# Patient Record
Sex: Male | Born: 1980 | Race: Black or African American | Hispanic: No | Marital: Married | Smoking: Current every day smoker
Health system: Southern US, Community
[De-identification: ages and names within clinical notes are randomized; demographics above are authoritative.]

## PROBLEM LIST (undated history)

## (undated) HISTORY — PX: FOOT SURGERY: SHX648

## (undated) HISTORY — PX: FRACTURE SURGERY: SHX138

## (undated) HISTORY — PX: CYST EXCISION: SHX5701

## (undated) HISTORY — PX: LEG SURGERY: SHX1003

---

## 2004-08-17 ENCOUNTER — Emergency Department (HOSPITAL_COMMUNITY): Admission: EM | Admit: 2004-08-17 | Discharge: 2004-08-17 | Payer: Self-pay | Admitting: *Deleted

## 2009-10-11 ENCOUNTER — Ambulatory Visit (HOSPITAL_COMMUNITY): Admission: RE | Admit: 2009-10-11 | Discharge: 2009-10-11 | Payer: Self-pay | Admitting: Family Medicine

## 2010-01-22 ENCOUNTER — Emergency Department (HOSPITAL_COMMUNITY): Admission: EM | Admit: 2010-01-22 | Discharge: 2010-01-22 | Payer: Self-pay | Admitting: Emergency Medicine

## 2016-05-17 ENCOUNTER — Emergency Department (HOSPITAL_COMMUNITY)
Admission: EM | Admit: 2016-05-17 | Discharge: 2016-05-17 | Disposition: A | Discharge status: Home / Self Care | Payer: Self-pay | Attending: Emergency Medicine | Admitting: Emergency Medicine

## 2016-05-17 ENCOUNTER — Encounter (HOSPITAL_COMMUNITY): Payer: Self-pay | Admitting: Emergency Medicine

## 2016-05-17 DIAGNOSIS — Y990 Civilian activity done for income or pay: Secondary | ICD-10-CM | POA: Insufficient documentation

## 2016-05-17 DIAGNOSIS — W57XXXA Bitten or stung by nonvenomous insect and other nonvenomous arthropods, initial encounter: Secondary | ICD-10-CM | POA: Insufficient documentation

## 2016-05-17 DIAGNOSIS — Y93H2 Activity, gardening and landscaping: Secondary | ICD-10-CM | POA: Insufficient documentation

## 2016-05-17 DIAGNOSIS — Y929 Unspecified place or not applicable: Secondary | ICD-10-CM | POA: Insufficient documentation

## 2016-05-17 DIAGNOSIS — S90861A Insect bite (nonvenomous), right foot, initial encounter: Secondary | ICD-10-CM | POA: Insufficient documentation

## 2016-05-17 DIAGNOSIS — F17210 Nicotine dependence, cigarettes, uncomplicated: Secondary | ICD-10-CM | POA: Insufficient documentation

## 2016-05-17 DIAGNOSIS — S90821A Blister (nonthermal), right foot, initial encounter: Secondary | ICD-10-CM

## 2016-05-17 MED ORDER — MUPIROCIN CALCIUM 2 % NA OINT: TOPICAL_OINTMENT | NASAL | Status: DC

## 2016-05-17 NOTE — Discharge Instructions (Signed)
Blisters A blister is a fluid-filled sac that forms between layers of skin. Blisters often form in areas where skin rubs against other skin or rubs against something else. The most common areas for blisters are the hands and feet. CAUSES A blister can be caused by:  An injury.  A burn.  An allergic reaction.  An infection.  Exposure to irritating chemicals.  Friction. Friction blisters often result from:  Sports.  Repetitive activities.  Shoes that are too tight or too loose. SIGNS AND SYMPTOMS A blister is often round and looks like a bump. It may itch or be painful to the touch. The liquid in a blister is clear or bloody. Before a blister forms, the skin may become red, feel warm, itch, or be painful to the touch. DIAGNOSIS A blister can usually be diagnosed from its appearance. TREATMENT Treatment involves protecting the area where the blister has formed until the skin has healed. If something is likely to rub against the blister, apply a bandage (dressing) with a hole in the middle over the blister. Most blisters break open, dry up, and go away on their own within 10 days. Rarely, blisters that are very painful may be drained before they break open on their own. Draining of a blister should only be done by a health care provider under sterile conditions. HOME CARE INSTRUCTIONS  Protect the area where the blister has formed as directed by your health care provider.  Do not open or pop your blister, because it could become infected.  If the blister is very painful, ask your health care provider whether you should have it drained.  If the blister breaks open on its own:  Do not remove the loose skin that is over the blister.  Wash the blister area with soap and water every day.  After washing the blister area, you may apply an antibiotic cream or ointment and cover the area with a bandage. PREVENTION Taking these steps can help to prevent blisters that are caused by  friction:  Wear comfortable shoes that fit well.  Always wear socks with shoes.  Wear extra socks or use tape, bandages, or pads over blister-prone areas as needed.  Wear protective gear, such as gloves, when participating in sports or activities that can cause blisters.  Use powders as needed to keep your feet dry. SEEK MEDICAL CARE IF:  You have increased redness, swelling, or pain in the blister area.  A puslike discharge is coming from the blister area.  You have a fever.  You have chills.   This information is not intended to replace advice given to you by your health care provider. Make sure you discuss any questions you have with your health care provider.  Wash wound with antibacterial soap and water. Apply antibiotic ointment and cover with a bandage before wearing shoes. Follow up with [facility] [place] and [facility] if your symptoms do not improve. Return to the Emergency Department if you experience severe worsening of your symptoms, fevers, chills, increased redness or swelling around your wound.

## 2016-05-17 NOTE — ED Notes (Signed)
PT has a 1cm wide red, raised, round area on top of right foot. PT believes he may have been bitten by a spider Friday at work, however PT wore boots all day that day. PT reports no sign of bite appeared until Sunday and it has worsened since then. It appears as though area may have blistered at some point and opened. No signs of infection. PT has been cleaning it with peroxide.

## 2016-05-17 NOTE — ED Provider Notes (Signed)
CSN: [id number]     Arrival date & time 05/17/16  1144 History  By signing my name below, I, Freida Busmaniana Omoyeni, attest that this documentation has been prepared under the direction and in the presence of non-physician practitioner, Gaylyn RongSamantha Kasee Hantz, PA-C. Electronically Signed: Freida Busmaniana Omoyeni, Scribe. 05/17/2016. 12:14 PM.  Chief Complaint  Patient presents with  . Insect Bite   The history is provided by the patient. No language interpreter was used.     HPI Comments:  Sharen Counterekeywan C Milos is a 35 y.o. male who presents to the Emergency Department complaining of small, circular, wound to the top of his right foot which he first noticed 4 days ago. He denies pain to the site. Pt has cleaned the site with peroxide and has kept it covered with a band-aid. Pt notes possible spider bite ~ 6 days ago while at work. 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  He denies fever and chills. Pt has no other complaints or symptoms at this time.    History reviewed. No pertinent past medical history. Past Surgical History  Procedure Laterality Date  . Fracture surgery     No family history on file. Social History  Substance Use Topics  . Smoking status: Current Every Day Smoker -- 1.00 packs/day    Types: Cigarettes  . Smokeless tobacco: None  . Alcohol Use: Yes     Comment: 2-3 units per week    Review of Systems  10 systems reviewed and all are negative for acute change except as noted in the HPI.   Allergies  Review of patient's allergies indicates no known allergies.  Home Medications   Prior to Admission medications   Not on File   BP 129/78 mmHg  Pulse 95  Temp(Src) 98.3 F (36.8 C) (Oral)  Resp 20  SpO2 97% Physical Exam  Constitutional: He is oriented to person, place, and time. He appears well-developed and well-nourished. No distress.  HENT:  Head: Normocephalic and atraumatic.  Eyes: Conjunctivae are normal. Right eye exhibits  no discharge. Left eye exhibits no discharge. No scleral icterus.  Cardiovascular: Normal rate and intact distal pulses.   Pulmonary/Chest: Effort normal.  Neurological: He is alert and oriented to person, place, and time. Coordination normal.  Skin: Skin is warm and dry. He is not diaphoretic. No erythema. No pallor.  1 cm x 1 cm superficial open vesicle to the dorsum of the right foot No surrounding erythema, tenderness, fluctuance, or induration   Psychiatric: He has a normal mood and affect. His behavior is normal.  Nursing note and vitals reviewed.   ED Course  Procedures   DIAGNOSTIC STUDIES:  Oxygen Saturation is 97% on RA, normal by my interpretation.    COORDINATION OF CARE:  12:04 PM Discussed treatment plan with pt at bedside and pt agreed to plan.    MDM   Final diagnoses:  Blister of foot without infection, right, initial encounter    Patient to ED for evaluation of wound to right foot. There is a small superficial blister (open vesicle) to dorsum of right foot. No signs of acute infection. Pt is afebrile and hemodynamically stable. Pt is instructed to continue with home care. Antibiotic ointment placed as well as bandaid. Pt has a good understanding of return precautions and is safe for discharge at this time.  I personally performed the services described in this documentation, which was scribed in my presence. The recorded information  has been reviewed and is accurate.     Lester KinsmanSamantha Tripp [place], PA-C 05/18/16 1001  Glynn OctaveStephen Rancour, MD 05/18/16 1030

## 2016-07-21 ENCOUNTER — Emergency Department (HOSPITAL_COMMUNITY): Payer: Self-pay

## 2016-07-21 ENCOUNTER — Emergency Department (HOSPITAL_COMMUNITY)
Admission: EM | Admit: 2016-07-21 | Discharge: 2016-07-21 | Disposition: A | Discharge status: Home / Self Care | Payer: Self-pay | Attending: Emergency Medicine | Admitting: Emergency Medicine

## 2016-07-21 ENCOUNTER — Encounter (HOSPITAL_COMMUNITY): Payer: Self-pay | Admitting: Emergency Medicine

## 2016-07-21 DIAGNOSIS — M6740 Ganglion, unspecified site: Secondary | ICD-10-CM

## 2016-07-21 DIAGNOSIS — F17210 Nicotine dependence, cigarettes, uncomplicated: Secondary | ICD-10-CM | POA: Insufficient documentation

## 2016-07-21 DIAGNOSIS — M67441 Ganglion, right hand: Secondary | ICD-10-CM | POA: Insufficient documentation

## 2016-07-21 MED ORDER — PREDNISONE 20 MG PO TABS
60.0000 mg | ORAL_TABLET | Freq: Once | ORAL | Status: AC
  Administered 2016-07-21: 60 mg via ORAL
  Filled 2016-07-21: qty 3

## 2016-07-21 NOTE — ED Triage Notes (Signed)
Pt. reports right hand cyst with pain and numbness onset this week , denies injury .

## 2016-07-21 NOTE — ED Provider Notes (Signed)
[facility]-EMERGENCY DEPT Provider Note   CSN: [id number] Arrival date & time: 07/21/16  0152     History   Chief Complaint Chief Complaint  Patient presents with  . Hand Problem    HPI Philip Phelps is a 35 y.o. male.  HPI  Pt to the ER with complaints of pain to back pf right hand. He says he had a cyst on his hand he had drained many years ago and was told that it may grow back. He says that he is getting tingling to his fingers and that it is uncomfortable. Denies warmth, weakness, fevers, loss of sensation to fingers.   History reviewed. No pertinent past medical history.  There are no active problems to display for this patient.   Past Surgical History:  Procedure Laterality Date  . FRACTURE SURGERY         Home Medications    Prior to Admission medications   Medication Sig Start Date End Date Taking? Authorizing Provider  mupirocin nasal ointment (BACTROBAN) 2 % Apply to wound daily 05/17/16   Dub MikesSamantha Tripp [place], PA-C    Family History No family history on file.  Social History Social History  Substance Use Topics  . Smoking status: Current Every Day Smoker    Packs/day: 0.00    Types: Cigarettes  . Smokeless tobacco: Never Used  . Alcohol use Yes     Comment: 2-3 units per week     Allergies   Review of patient's allergies indicates no known allergies.   Review of Systems Review of Systems Review of Systems All other systems negative except as documented in the HPI. All pertinent positives and negatives as reviewed in the HPI.   Physical Exam Updated Vital Signs BP 123/80 (BP Location: Right Arm)   Pulse 65   Temp 98 F (36.7 C) (Oral)   Resp 16   Ht 5\' 7"  (1.702 m)   Wt 68 kg   SpO2 98%   BMI 23.49 kg/m   Physical Exam  Musculoskeletal:  neuro vascularity intact.   Physical Exam  Constitutional: She appears well-developed and well-nourished. No distress.  HENT:  Head: Normocephalic and atraumatic.  Eyes: Pupils are equal,  round, and reactive to light.  Neck: Normal range of motion. Neck supple.  Cardiovascular: Normal rate.   Pulmonary/Chest: Effort normal.  Musculoskeletal:  Pt has two small ganglion cyst to posterior of right hand that is tender to the touch. No rash, no abscess or cellulitis.  Neurological: She is alert.  Skin: Skin is warm and dry.  Nursing note and vitals reviewed.   ED Treatments / Results  Labs (all labs ordered are listed, but only abnormal results are displayed) Labs Reviewed - No data to display  EKG  EKG Interpretation None       Radiology Dg Hand Complete Right  Result Date: 07/21/2016 CLINICAL DATA:  35 year old male with right hand pain. EXAM: RIGHT HAND - COMPLETE 3+ VIEW COMPARISON:  None. FINDINGS: There is no evidence of fracture or dislocation. There is no evidence of arthropathy or other focal bone abnormality. Soft tissues are unremarkable. IMPRESSION: Negative. Electronically Signed   By: Elgie CollardArash  Radparvar M.D.   On: 07/21/2016 02:56    Procedures Procedures (including critical care time)  Medications Ordered in ED Medications  predniSONE (DELTASONE) tablet 60 mg (not administered)     Initial Impression / Assessment and Plan / ED Course  I have reviewed the triage vital signs and the nursing notes.  Pertinent labs &  imaging results that were available during my care of the patient were reviewed by me and considered in my medical decision making (see chart for details).  Clinical Course    Referral to hand surgeon for treatment of ganglion cyst as it is causing patient discomfort. No signs of infection, NIV.  Final Clinical Impressions(s) / ED Diagnoses   Final diagnoses:  Ganglion cyst    New Prescriptions New Prescriptions   No medications on file     Marlon Pel, PA-C 07/21/16 0538    Marlon Pel, PA-C 07/21/16 2956    Gilda Crease, MD 07/21/16 [id number]

## 2016-07-25 ENCOUNTER — Encounter: Payer: Self-pay | Admitting: Orthopaedic Surgery

## 2016-07-25 ENCOUNTER — Ambulatory Visit (INDEPENDENT_AMBULATORY_CARE_PROVIDER_SITE_OTHER): Payer: Self-pay | Admitting: Orthopaedic Surgery

## 2016-07-25 VITALS — BP 128/69 | HR 68 | Ht 69.0 in | Wt 150.0 lb

## 2016-07-25 DIAGNOSIS — M67431 Ganglion, right wrist: Secondary | ICD-10-CM

## 2016-07-25 MED ORDER — HYDROCODONE-ACETAMINOPHEN 5-325 MG PO TABS: 1.0000 | ORAL_TABLET | ORAL | 0 refills | Status: DC | PRN

## 2016-07-25 NOTE — Progress Notes (Signed)
Subjective: I have a ganglion of the right wrist and numbness    Patient ID: Philip Phelps, male    DOB: May 15, 1981, 35 y.o.   MRN: [id number]  HPI He has developed a ganglion of the right wrist ulnar side that extends up over the dorsal wrist to the radial side in a string like manner.  He has numbness of the right little finger and pain when he touches the cyst over the distal ulna.  He has shooting pains.  The cysts slowly developed over the last several months.  He went to the ER about this on 07-21-16.  He was referred here.  I treated him and aspirated a ganglion on the left wrist about five or six years ago that went away with no problem.  He works in [employer] do the work as the pain and numbness have gotten worse.  I told him I would try to aspirate it but it appears to be near the ulna nerve laterally and he may need excision.  He understands.   Review of Systems  HENT: Negative for congestion.   Respiratory: Negative for cough and shortness of breath.   Cardiovascular: Negative for chest pain and leg swelling.  Endocrine: Negative for cold intolerance.  Musculoskeletal: Positive for joint swelling.  Allergic/Immunologic: Negative for environmental allergies.  All other systems reviewed and are negative.  No past medical history on file.  Past Surgical History:  Procedure Laterality Date  . FRACTURE SURGERY      Current Outpatient Prescriptions on File Prior to Visit  Medication Sig Dispense Refill  . mupirocin nasal ointment (BACTROBAN) 2 % Apply to wound daily 1 g 0   No current facility-administered medications on file prior to visit.     Social History   Social History  . Marital status: Divorced    Spouse name: N/A  . Number of children: N/A  . Years of education: N/A   Occupational History  . Not on file.   Social History Main Topics  . Smoking status: Current Every Day Smoker    Packs/day: 0.00    Types: Cigarettes  . Smokeless  tobacco: Never Used  . Alcohol use Yes     Comment: 2-3 units per week  . Drug use: No  . Sexual activity: Not on file   Other Topics Concern  . Not on file   Social History Narrative  . No narrative on file    Family History  Problem Relation Age of Onset  . Stroke Father   . Hypertension Father     BP 128/69   Pulse 68   Ht 5\' 9"  (1.753 m)   Wt 150 lb (68 kg)   BMI 22.15 kg/m       Objective:   Physical Exam  Constitutional: He is oriented to person, place, and time. He appears well-developed and well-nourished.  HENT:  Head: Normocephalic and atraumatic.  Eyes: Conjunctivae and EOM are normal. Pupils are equal, round, and reactive to light.  Neck: Normal range of motion. Neck supple.  Cardiovascular: Normal rate, regular rhythm and intact distal pulses.   Pulmonary/Chest: Effort normal.  Abdominal: Soft.  Musculoskeletal: He exhibits tenderness (He has ganglion cyst over the ulnar lateral side about 1 cm x 3cm with extension in a string  like fashion up over the dorsum of the wrist to the radial side with several small pea like ganglions.).  Neurological: He is alert and oriented to person, place, and time. He  has normal reflexes. He displays normal reflexes. No cranial nerve deficit. He exhibits normal muscle tone. Coordination normal.  Skin: Skin is warm and dry.  Psychiatric: He has a normal mood and affect. His behavior is normal. Judgment and thought content normal.   He is left hand dominant.  He has decreased sensation of the right little finger and weakness of the right hand intrinsics more ulnarward.  ROM of the fingers is full.  He has shooting pain when pushing against the ganglion.  Procedure Note:  After sterile prep and permission from the patient, the ganglion on the ulnar side of the right wrist had attempted aspiration with a 18 gauge needle.  I was unable to get any fluid.  I was unsuccessful.      Assessment & Plan:   Encounter Diagnosis   Name Primary?  . Ganglion cyst of wrist, right Yes   [place] [place] and possible surgical excision of the cysts.  The patient is agreeable.  He is to remain out of work.  Electronically Signed Darreld McleanWayne Jeweldean Drohan, MD 9/12/201711:03 AM

## 2016-07-25 NOTE — Patient Instructions (Signed)
Smoking Cessation, Tips for Success If you are ready to quit smoking, congratulations! You have chosen to help yourself be healthier. Cigarettes bring nicotine, tar, carbon monoxide, and other irritants into your body. Your lungs, heart, and blood vessels will be able to work better without these poisons. There are many different ways to quit smoking. Nicotine gum, nicotine patches, a nicotine inhaler, or nicotine nasal spray can help with physical craving. Hypnosis, support groups, and medicines help break the habit of smoking. WHAT THINGS CAN I DO TO MAKE QUITTING EASIER?  Here are some tips to help you quit for good:  Pick a date when you will quit smoking completely. Tell all of your friends and family about your plan to quit on that date.  Do not try to slowly cut down on the number of cigarettes you are smoking. Pick a quit date and quit smoking completely starting on that day.  Throw away all cigarettes.   Clean and remove all ashtrays from your home, work, and car.  On a card, write down your reasons for quitting. Carry the card with you and read it when you get the urge to smoke.  Cleanse your body of nicotine. Drink enough water and fluids to keep your urine clear or pale yellow. Do this after quitting to flush the nicotine from your body.  Learn to predict your moods. Do not let a bad situation be your excuse to have a cigarette. Some situations in your life might tempt you into wanting a cigarette.  Never have "just one" cigarette. It leads to wanting another and another. Remind yourself of your decision to quit.  Change habits associated with smoking. If you smoked while driving or when feeling stressed, try other activities to replace smoking. Stand up when drinking your coffee. Brush your teeth after eating. Sit in a different chair when you read the paper. Avoid alcohol while trying to quit, and try to drink fewer caffeinated beverages. Alcohol and caffeine may urge you to  smoke.  Avoid foods and drinks that can trigger a desire to smoke, such as sugary or spicy foods and alcohol.  Ask people who smoke not to smoke around you.  Have something planned to do right after eating or having a cup of coffee. For example, plan to take a walk or exercise.  Try a relaxation exercise to calm you down and decrease your stress. Remember, you may be tense and nervous for the first 2 weeks after you quit, but this will pass.  Find new activities to keep your hands busy. Play with a pen, coin, or rubber band. Doodle or draw things on paper.  Brush your teeth right after eating. This will help cut down on the craving for the taste of tobacco after meals. You can also try mouthwash.   Use oral substitutes in place of cigarettes. Try using lemon drops, carrots, cinnamon sticks, or chewing gum. Keep them handy so they are available when you have the urge to smoke.  When you have the urge to smoke, try deep breathing.  Designate your home as a nonsmoking area.  If you are a heavy smoker, ask your health care provider about a prescription for nicotine chewing gum. It can ease your withdrawal from nicotine.  Reward yourself. Set aside the cigarette money you save and buy yourself something nice.  Look for support from others. Join a support group or smoking cessation program. Ask someone at home or at work to help you with your plan   to quit smoking.  Always ask yourself, "Do I need this cigarette or is this just a reflex?" Tell yourself, "Today, I choose not to smoke," or "I do not want to smoke." You are reminding yourself of your decision to quit.  Do not replace cigarette smoking with electronic cigarettes (commonly called e-cigarettes). The safety of e-cigarettes is unknown, and some may contain harmful chemicals.  If you relapse, do not give up! Plan ahead and think about what you will do the next time you get the urge to smoke. HOW WILL I FEEL WHEN I QUIT SMOKING? You  may have symptoms of withdrawal because your body is used to nicotine (the addictive substance in cigarettes). You may crave cigarettes, be irritable, feel very hungry, cough often, get headaches, or have difficulty concentrating. The withdrawal symptoms are only temporary. They are strongest when you first quit but will go away within 10-14 days. When withdrawal symptoms occur, stay in control. Think about your reasons for quitting. Remind yourself that these are signs that your body is healing and getting used to being without cigarettes. Remember that withdrawal symptoms are easier to treat than the major diseases that smoking can cause.  Even after the withdrawal is over, expect periodic urges to smoke. However, these cravings are generally short lived and will go away whether you smoke or not. Do not smoke! WHAT RESOURCES ARE AVAILABLE TO HELP ME QUIT SMOKING? Your health care provider can direct you to community resources or hospitals for support, which may include:  Group support.  Education.  Hypnosis.  Therapy.   This information is not intended to replace advice given to you by your health care provider. Make sure you discuss any questions you have with your health care provider.   Document Released: 07/28/2004 Document Revised: 11/20/2014 Document Reviewed: 04/17/2013 Elsevier Interactive Patient Education 2016 [employer]  

## 2016-08-18 ENCOUNTER — Other Ambulatory Visit: Payer: Self-pay | Admitting: Orthopaedic Surgery

## 2016-08-18 NOTE — Progress Notes (Signed)
Surgery on 08/25/16.  Need orders in [facility].  Thank You.

## 2016-08-23 NOTE — Patient Instructions (Addendum)
Sharen Counterekeywan C Schweiger  08/23/2016   Your procedure is scheduled on: 08-25-16  [facility]  [facility] to 3rd floor to  Short Stay Center at  0900 AM.  Call this number if you have problems the morning of surgery [phone]   Remember: ONLY 1 PERSON MAY GO WITH YOU TO SHORT STAY TO GET  READY MORNING OF YOUR SURGERY.  Do not eat food or drink liquids :After Midnight.     Take these medicines the morning of surgery with A SIP OF WATER:  DO NOT TAKE ANY DIABETIC MEDICATIONS DAY OF YOUR SURGERY                               You may not have any metal on your body including hair pins and              piercings  Do not wear jewelry, make-up, lotions, powders or perfumes, deodorant             Do not wear nail polish.  Do not shave  48 hours prior to surgery.              Men may shave face and neck.   Do not bring valuables to the hospital. [facility] IS NOT             RESPONSIBLE   FOR VALUABLES.  Contacts, dentures or bridgework may not be worn into surgery.  Leave suitcase in the car. After surgery it may be brought to your room.     Patients discharged the day of surgery will not be allowed to drive home.  Name and phone number of your driver: Odis LusterKeysha-spouse [phone] cell  Special Instructions: N/A              Please read over the following fact sheets you were given: _____________________________________________________________________             [facility] - Preparing for [facility] Before surgery, you can play an important role.  Because skin is not sterile, your skin needs to be as free of germs as possible.  You can reduce the number of germs on your skin by washing with CHG (chlorahexidine gluconate) soap before surgery.  CHG is an antiseptic cleaner which kills germs and bonds with the skin to continue killing germs even after washing. Please DO NOT use if you have an allergy to CHG or antibacterial soaps.  If  your skin becomes reddened/irritated stop using the CHG and inform your nurse when you arrive at Short Stay. Do not shave (including legs and underarms) for at least 48 hours prior to the first CHG shower.  You may shave your face/neck. Please follow these instructions carefully:  1.  Shower with CHG Soap the night before surgery and the  morning of Surgery.  2.  If you choose to wash your hair, wash your hair first as usual with your  normal  shampoo.  3.  After you shampoo, rinse your hair and body thoroughly to remove the  shampoo.                           4.  Use CHG as you would any other liquid soap.  You can apply chg directly  to the skin  and wash                       Gently with a scrungie or clean washcloth.  5.  Apply the CHG Soap to your body ONLY FROM THE NECK DOWN.   Do not use on face/ open                           Wound or open sores. Avoid contact with eyes, ears mouth and genitals (private parts).                       Wash face,  Genitals (private parts) with your normal soap.             6.  Wash thoroughly, paying special attention to the area where your surgery  will be performed.  7.  Thoroughly rinse your body with warm water from the neck down.  8.  DO NOT shower/wash with your normal soap after using and rinsing off  the CHG Soap.                9.  Pat yourself dry with a clean towel.            10.  Wear clean pajamas.            11.  Place clean sheets on your bed the night of your first shower and do not  sleep with pets. Day of Surgery : Do not apply any lotions/deodorants the morning of surgery.  Please wear clean clothes to the hospital/surgery center.  FAILURE TO FOLLOW THESE INSTRUCTIONS MAY RESULT IN THE CANCELLATION OF YOUR SURGERY PATIENT SIGNATURE_________________________________  NURSE SIGNATURE__________________________________  ________________________________________________________________________

## 2016-08-24 ENCOUNTER — Encounter (HOSPITAL_COMMUNITY)
Admission: RE | Admit: 2016-08-24 | Discharge: 2016-08-24 | Disposition: A | Discharge status: Home / Self Care | Payer: Self-pay | Source: Ambulatory Visit | Attending: Orthopaedic Surgery | Admitting: Orthopaedic Surgery

## 2016-08-24 ENCOUNTER — Encounter (HOSPITAL_COMMUNITY): Payer: Self-pay

## 2016-08-24 DIAGNOSIS — Z01818 Encounter for other preprocedural examination: Secondary | ICD-10-CM | POA: Insufficient documentation

## 2016-08-25 ENCOUNTER — Ambulatory Visit (HOSPITAL_COMMUNITY): Payer: Self-pay | Admitting: Certified Registered"

## 2016-08-25 ENCOUNTER — Encounter (HOSPITAL_COMMUNITY): Payer: Self-pay

## 2016-08-25 ENCOUNTER — Encounter (HOSPITAL_COMMUNITY)
Admission: RE | Disposition: A | Discharge status: Home / Self Care | Payer: Self-pay | Source: Ambulatory Visit | Attending: Orthopaedic Surgery

## 2016-08-25 ENCOUNTER — Ambulatory Visit (HOSPITAL_COMMUNITY)
Admission: RE | Admit: 2016-08-25 | Discharge: 2016-08-25 | Disposition: A | Discharge status: Home / Self Care | Payer: Self-pay | Source: Ambulatory Visit | Attending: Orthopaedic Surgery | Admitting: Orthopaedic Surgery

## 2016-08-25 DIAGNOSIS — M67431 Ganglion, right wrist: Secondary | ICD-10-CM

## 2016-08-25 DIAGNOSIS — F17210 Nicotine dependence, cigarettes, uncomplicated: Secondary | ICD-10-CM | POA: Insufficient documentation

## 2016-08-25 HISTORY — PX: GANGLION CYST EXCISION: SHX1691

## 2016-08-25 SURGERY — EXCISION, GANGLION CYST, WRIST
Anesthesia: General | Site: Wrist | Laterality: Right

## 2016-08-25 MED ORDER — HYDROMORPHONE HCL 1 MG/ML IJ SOLN
INTRAMUSCULAR | Status: AC
  Filled 2016-08-25: qty 1

## 2016-08-25 MED ORDER — LIDOCAINE 2% (20 MG/ML) 5 ML SYRINGE
INTRAMUSCULAR | Status: DC | PRN
  Administered 2016-08-25: 60 mg via INTRAVENOUS

## 2016-08-25 MED ORDER — PROMETHAZINE HCL 25 MG/ML IJ SOLN
6.2500 mg | INTRAMUSCULAR | Status: DC | PRN
Start: 2016-08-25 — End: 2016-08-25

## 2016-08-25 MED ORDER — MIDAZOLAM HCL 2 MG/2ML IJ SOLN
INTRAMUSCULAR | Status: DC | PRN
  Administered 2016-08-25 (×2): 1 mg via INTRAVENOUS

## 2016-08-25 MED ORDER — 0.9 % SODIUM CHLORIDE (POUR BTL) OPTIME
TOPICAL | Status: DC | PRN
  Administered 2016-08-25: 1000 mL

## 2016-08-25 MED ORDER — LACTATED RINGERS IV SOLN
INTRAVENOUS | Status: DC
  Administered 2016-08-25: 1000 mL via INTRAVENOUS
  Administered 2016-08-25: 11:00:00 via INTRAVENOUS

## 2016-08-25 MED ORDER — FENTANYL CITRATE (PF) 100 MCG/2ML IJ SOLN
INTRAMUSCULAR | Status: DC | PRN
  Administered 2016-08-25 (×2): 50 ug via INTRAVENOUS

## 2016-08-25 MED ORDER — BUPIVACAINE HCL (PF) 0.25 % IJ SOLN
INTRAMUSCULAR | Status: AC
  Filled 2016-08-25: qty 30

## 2016-08-25 MED ORDER — ONDANSETRON HCL 4 MG/2ML IJ SOLN
INTRAMUSCULAR | Status: DC | PRN
  Administered 2016-08-25: 4 mg via INTRAVENOUS

## 2016-08-25 MED ORDER — HYDROMORPHONE HCL 1 MG/ML IJ SOLN
0.2500 mg | INTRAMUSCULAR | Status: DC | PRN
  Administered 2016-08-25: 0.5 mg via INTRAVENOUS

## 2016-08-25 MED ORDER — CEFAZOLIN SODIUM-DEXTROSE 2-4 GM/100ML-% IV SOLN
INTRAVENOUS | Status: AC
  Filled 2016-08-25: qty 100

## 2016-08-25 MED ORDER — OXYCODONE-ACETAMINOPHEN 5-325 MG PO TABS: 1.0000 | ORAL_TABLET | ORAL | 0 refills | Status: AC | PRN

## 2016-08-25 MED ORDER — CEFAZOLIN SODIUM-DEXTROSE 2-4 GM/100ML-% IV SOLN
2.0000 g | INTRAVENOUS | Status: AC
  Administered 2016-08-25: 2 g via INTRAVENOUS
  Filled 2016-08-25: qty 100

## 2016-08-25 MED ORDER — KETOROLAC TROMETHAMINE 30 MG/ML IJ SOLN
30.0000 mg | Freq: Once | INTRAMUSCULAR | Status: DC | PRN
Start: 2016-08-25 — End: 2016-08-25

## 2016-08-25 MED ORDER — PROPOFOL 10 MG/ML IV BOLUS
INTRAVENOUS | Status: DC | PRN
  Administered 2016-08-25: 200 mg via INTRAVENOUS

## 2016-08-25 MED ORDER — PROPOFOL 10 MG/ML IV BOLUS
INTRAVENOUS | Status: AC
  Filled 2016-08-25: qty 20

## 2016-08-25 MED ORDER — MIDAZOLAM HCL 2 MG/2ML IJ SOLN
INTRAMUSCULAR | Status: AC
  Filled 2016-08-25: qty 2

## 2016-08-25 MED ORDER — FENTANYL CITRATE (PF) 100 MCG/2ML IJ SOLN
INTRAMUSCULAR | Status: AC
  Filled 2016-08-25: qty 2

## 2016-08-25 MED ORDER — BUPIVACAINE HCL (PF) 0.25 % IJ SOLN
INTRAMUSCULAR | Status: DC | PRN
  Administered 2016-08-25: 10 mL

## 2016-08-25 SURGICAL SUPPLY — 31 items
BANDAGE ACE 3X5.8 VEL STRL LF (GAUZE/BANDAGES/DRESSINGS) ×3 IMPLANT
BANDAGE ACE 4X5 VEL STRL LF (GAUZE/BANDAGES/DRESSINGS) ×3 IMPLANT
BNDG CONFORM 3 STRL LF (GAUZE/BANDAGES/DRESSINGS) ×3 IMPLANT
BNDG GAUZE ELAST 4 BULKY (GAUZE/BANDAGES/DRESSINGS) ×3 IMPLANT
CORDS BIPOLAR (ELECTRODE) ×3 IMPLANT
CUFF TOURN SGL QUICK 18 (TOURNIQUET CUFF) ×3 IMPLANT
CUFF TOURN SGL QUICK 24 (TOURNIQUET CUFF)
CUFF TRNQT CYL 24X4X40X1 (TOURNIQUET CUFF) IMPLANT
DRAPE SURG 17X11 SM STRL (DRAPES) IMPLANT
DURAPREP 26ML APPLICATOR (WOUND CARE) ×3 IMPLANT
GAUZE SPONGE 4X4 12PLY STRL (GAUZE/BANDAGES/DRESSINGS) ×3 IMPLANT
GAUZE XEROFORM 1X8 LF (GAUZE/BANDAGES/DRESSINGS) ×3 IMPLANT
GLOVE BIOGEL PI IND STRL 8 (GLOVE) ×3 IMPLANT
GLOVE BIOGEL PI INDICATOR 8 (GLOVE) ×6
GLOVE ECLIPSE 8.0 STRL XLNG CF (GLOVE) ×3 IMPLANT
GLOVE ORTHO TXT STRL SZ7.5 (GLOVE) ×3 IMPLANT
GOWN STRL REUS W/TWL LRG LVL4 (GOWN DISPOSABLE) ×3 IMPLANT
GOWN STRL REUS W/TWL XL LVL3 (GOWN DISPOSABLE) ×3 IMPLANT
KIT BASIN OR (CUSTOM PROCEDURE TRAY) ×3 IMPLANT
NEEDLE HYPO 22GX1.5 SAFETY (NEEDLE) ×3 IMPLANT
NS IRRIG 1000ML POUR BTL (IV SOLUTION) ×3 IMPLANT
PACK ORTHO EXTREMITY (CUSTOM PROCEDURE TRAY) ×3 IMPLANT
PAD CAST 4YDX4 CTTN HI CHSV (CAST SUPPLIES) IMPLANT
PADDING CAST COTTON 4X4 STRL (CAST SUPPLIES)
POSITIONER SURGICAL ARM (MISCELLANEOUS) IMPLANT
SUCTION FRAZIER HANDLE 10FR (MISCELLANEOUS) ×2
SUCTION TUBE FRAZIER 10FR DISP (MISCELLANEOUS) ×1 IMPLANT
SUT ETHILON 4 0 PS 2 18 (SUTURE) ×3 IMPLANT
SYR CONTROL 10ML LL (SYRINGE) ×3 IMPLANT
TOWEL OR 17X26 10 PK STRL BLUE (TOWEL DISPOSABLE) ×6 IMPLANT
WATER STERILE IRR 1500ML POUR (IV SOLUTION) IMPLANT

## 2016-08-25 NOTE — Discharge Instructions (Signed)
General Anesthesia, Adult, Care After Refer to this sheet in the next few weeks. These instructions provide you with information on caring for yourself after your procedure. Your health care provider may also give you more specific instructions. Your treatment has been planned according to current medical practices, but problems sometimes occur. Call your health care provider if you have any problems or questions after your procedure. WHAT TO EXPECT AFTER THE PROCEDURE After the procedure, it is typical to experience:  Sleepiness.  Nausea and vomiting. HOME CARE INSTRUCTIONS  For the first 24 hours after general anesthesia:  Have a responsible person with you.  Do not drive a car. If you are alone, do not take public transportation.  Do not drink alcohol.  Do not take medicine that has not been prescribed by your health care provider.  Do not sign important papers or make important decisions.  You may resume a normal diet and activities as directed by your health care provider.  Change bandages (dressings) as directed.  If you have questions or problems that seem related to general anesthesia, call the hospital and ask for the anesthetist or anesthesiologist on call. SEEK MEDICAL CARE IF:  You have nausea and vomiting that continue the day after anesthesia.  You develop a rash. SEEK IMMEDIATE MEDICAL CARE IF:   You have difficulty breathing.  You have chest pain.  You have any allergic problems.   This information is not intended to replace advice given to you by your health care provider. Make sure you discuss any questions you have with your health care provider.   Document Released: 02/05/2001 Document Revised: 11/20/2014 Document Reviewed: 02/28/2012 Elsevier Interactive Patient Education 2016 [employer]     No heavy lifting or gripping with your right hand Ice and elevation for swelling Keep your dressing clean and dry. Leave your current dressing on for  the next 4-5 days. In 5 days, you can get your actual incisions wet in the shower. Band-aids daily in 5 days.

## 2016-08-25 NOTE — Transfer of Care (Signed)
Immediate Anesthesia Transfer of Care Note  Patient: Philip Phelps  Procedure(s) Performed: Procedure(s): REMOVAL GANGLION CYST OF RIGHT WRIST (Right)  Patient Location: PACU  Anesthesia Type:General  Level of Consciousness: awake and alert   Airway & Oxygen Therapy: Patient Spontanous Breathing and Patient connected to nasal cannula oxygen  Post-op Assessment: Report given to RN and Post -op Vital signs reviewed and stable  Post vital signs: Reviewed and stable  Last Vitals:  Vitals:   08/25/16 0924 08/25/16 1232  BP: 124/78 (P) 129/89  Pulse: 78 (P) 75  Resp: 18 (P) 15  Temp: 36.4 C (P) 36.7 C    Last Pain:  Vitals:   08/25/16 0924  TempSrc: Oral      Patients Stated Pain Goal: 3 (08/25/16 1040)  Complications: No apparent anesthesia complications

## 2016-08-25 NOTE — Anesthesia Postprocedure Evaluation (Signed)
Anesthesia Post Note  Patient: Philip Phelps  Procedure(s) Performed: Procedure(s) (LRB): REMOVAL GANGLION CYST OF RIGHT WRIST (Right)  Patient location during evaluation: PACU Anesthesia Type: General Level of consciousness: awake and alert Pain management: pain level controlled Vital Signs Assessment: post-procedure vital signs reviewed and stable Respiratory status: spontaneous breathing, nonlabored ventilation, respiratory function stable and patient connected to nasal cannula oxygen Cardiovascular status: blood pressure returned to baseline and stable Postop Assessment: no signs of nausea or vomiting Anesthetic complications: no    Last Vitals:  Vitals:   08/25/16 1311 08/25/16 1335  BP: (!) 119/95 (!) 129/97  Pulse: 75 70  Resp: 15 16  Temp: 37 C 36.6 C    Last Pain:  Vitals:   08/25/16 1335  TempSrc: Oral  PainSc:                  Kennieth RadFitzgerald, Keerat Denicola E

## 2016-08-25 NOTE — Brief Op Note (Signed)
08/25/2016  12:29 PM  PATIENT:  Philip Phelps  35 y.o. male  PRE-OPERATIVE DIAGNOSIS:  Right wrist ganglion cyst  POST-OPERATIVE DIAGNOSIS:  Right wrist ganglion cyst  PROCEDURE:  Procedure(s): REMOVAL GANGLION CYST OF RIGHT WRIST (Right)  SURGEON:  Surgeon(s) and Role:    * Kathryne Hitchhristopher Y Kathrynne Kulinski, MD - Primary  ANESTHESIA:   local and general  EBL:  Total I/O In: -  Out: 10 [Blood:10]  LOCAL MEDICATIONS USED:  MARCAINE      TOURNIQUET:   Total Tourniquet Time Documented: Upper Arm (Right) - 26 minutes Total: Upper Arm (Right) - 26 minutes  PLAN OF CARE: Admit to inpatient   PATIENT DISPOSITION:  PACU - hemodynamically stable.   Delay start of Pharmacological VTE agent (>24hrs) due to surgical blood loss or risk of bleeding: no
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
## 2016-08-25 NOTE — Anesthesia Procedure Notes (Signed)
Procedure Name: LMA Insertion Date/Time: 08/25/2016 11:45 AM Performed by: Minerva EndsMIRARCHI, Reigan Tolliver M Pre-anesthesia Checklist: Patient identified, Emergency Drugs available, Suction available and Patient being monitored Patient Re-evaluated:Patient Re-evaluated prior to inductionOxygen Delivery Method: Circle System Utilized Preoxygenation: Pre-oxygenation with 100% oxygen Intubation Type: IV induction Ventilation: Mask ventilation without difficulty LMA: LMA inserted LMA Size: 4.0 Tube type: Oral Number of attempts: 1 Placement Confirmation: positive ETCO2 Tube secured with: Tape Dental Injury: Teeth and Oropharynx as per pre-operative assessment  Comments: Smooth IV induction Sampson GoonFitzgerald---  LMA AM CRNA atraumatic--  Teeth and mouth as preop -- bilat BS Sampson GoonFitzgerald

## 2016-08-25 NOTE — Anesthesia Preprocedure Evaluation (Signed)
Anesthesia Evaluation  Patient identified by MRN, date of birth, ID band Patient awake    Reviewed: Allergy & Precautions, NPO status , Patient's Chart, lab work & pertinent test results  Airway Mallampati: II  TM Distance: >3 FB Neck ROM: Full    Dental  (+) Dental Advisory Given   Pulmonary Current Smoker,    breath sounds clear to auscultation       Cardiovascular negative cardio ROS   Rhythm:Regular Rate:Normal     Neuro/Psych negative neurological ROS     GI/Hepatic negative GI ROS, Neg liver ROS,   Endo/Other  negative endocrine ROS  Renal/GU negative Renal ROS     Musculoskeletal   Abdominal   Peds  Hematology negative hematology ROS (+)   Anesthesia Other Findings   Reproductive/Obstetrics                            No results found for: WBC, HGB, HCT, MCV, PLT No results found for: CREATININE, BUN, NA, K, CL, CO2  Anesthesia Physical Anesthesia Plan  ASA: I  Anesthesia Plan: General   Post-op Pain Management:    Induction: Intravenous  Airway Management Planned: LMA  Additional Equipment:   Intra-op Plan:   Post-operative Plan: Extubation in OR  Informed Consent: I have reviewed the patients History and Physical, chart, labs and discussed the procedure including the risks, benefits and alternatives for the proposed anesthesia with the patient or authorized representative who has indicated his/her understanding and acceptance.   Dental advisory given  Plan Discussed with: CRNA  Anesthesia Plan Comments:         Anesthesia Quick Evaluation

## 2016-08-26 NOTE — Op Note (Signed)
NAMLoyce Dys:  Philip Phelps, Philip Phelps             ACCOUNT NO.:  [id number]  MEDICAL RECORD NO.:  [id number]  LOCATION:  [facility]                         [facility]  PHYSICIAN:  Vanita PandaChristopher Y. Magnus IvanBlackman, M.D.DATE OF BIRTH:  [birth year]  DATE OF PROCEDURE:  08/25/2016 DATE OF DISCHARGE:  08/25/2016                              OPERATIVE REPORT   PREOPERATIVE DIAGNOSIS:  Right wrist dorsal and ulnar ganglion cyst.  POSTOPERATIVE DIAGNOSIS:  Right wrist dorsal and ulnar ganglion cyst.  PROCEDURE:  Excision of right wrist dorsal and ulnar ganglion cyst.  SURGEON:  Vanita Pandahristopher Y. Magnus IvanBlackman, M.D.  ANESTHESIA: 1. General. 2. Local with 0.25% plain Marcaine.  TOURNIQUET TIME:  Less than 30 minutes.  BLOOD LOSS:  Minimal.  COMPLICATIONS:  None.  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  He developed cystic structures on the dorsum of his wrist and this has been bothersome to him.  He has had aspirated before and showed gelatinous material, but he kept recurring.  This is also communication on the side of his wrist.  It has gotten where it is bothering him enough and painful and is pushing on the ulnar sensory nerve on the ulnar side of his wrist that he wishes to have this excised and I think this is certainly reasonable.  The risks and benefits of surgery were explained to him in detail and he did wish to proceed.  PROCEDURE DESCRIPTION:  After informed consent was obtained, appropriate right wrist was marked.  He was brought to the operating room and placed supine on the operating table with the right on an arm table.  General anesthesia was then obtained.  A nonsterile tourniquet was placed around his upper right arm, and his right hand and wrist were prepped and draped with DuraPrep and sterile drapes.  Time-out was called and he was identified as correct patient and correct right hand.  We then used an Esmarch to wrap out the hand.  Tourniquet  was inflated to 250 mm of pressure.  I then made an incision over the first dorsal ganglion that was pretty much between the second and third dorsal compartments.  I dissected down and then found a small cystic structure that I was able to excise.  I then irrigated the soft tissue with normal saline solution.  I closed the retinaculum over the cyst with a 3-0 Vicryl suture.  I then reapproximated the skin with interrupted 3-0 nylon.  I then went to the ulnar side of the wrist and made a curvilinear incision on the ulnar side of the wrist.  I did find a cystic structure in this area and it was definitely entangling the ulnar sensory nerve branch and had to slowly exercise this away, this seemed to be coming from more of a tendinous structure and I excised that as well.  I did open up the compartments of the wrist dorsally and did not find any other hole or stalk going down to the wrist joint.  I then irrigated the soft tissue on this side of the wrist and closed the incision with interrupted nylon suture.  Xeroform and well-padded sterile dressing were applied.  The tourniquet was  let down.  His fingers were pinked nicely.  He was awakened, extubated and taken to the recovery room in stable condition. All final counts were correct.  There were no complications noted. Postoperatively, we will discharge him to the PACU.  He is going to avoid heavy manual labor for at least minimum of 2 weeks.  We will see him back in the office in 2 weeks as well.     Vanita Panda. Magnus Ivan, M.D.     CYB/MEDQ  D:  08/25/2016  T:  08/26/2016  Job:  [id number]

## 2016-09-07 ENCOUNTER — Encounter (INDEPENDENT_AMBULATORY_CARE_PROVIDER_SITE_OTHER): Payer: Self-pay | Admitting: Physician Assistant

## 2016-09-07 ENCOUNTER — Ambulatory Visit (INDEPENDENT_AMBULATORY_CARE_PROVIDER_SITE_OTHER): Payer: Self-pay | Admitting: Physician Assistant

## 2016-09-07 DIAGNOSIS — M67431 Ganglion, right wrist: Secondary | ICD-10-CM
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
## 2016-09-11 ENCOUNTER — Ambulatory Visit (INDEPENDENT_AMBULATORY_CARE_PROVIDER_SITE_OTHER): Payer: Self-pay | Admitting: Physician Assistant

## 2016-09-11 ENCOUNTER — Encounter (INDEPENDENT_AMBULATORY_CARE_PROVIDER_SITE_OTHER): Payer: Self-pay | Admitting: Physician Assistant

## 2016-09-11 VITALS — Ht 67.0 in | Wt 149.0 lb

## 2016-09-11 DIAGNOSIS — M67431 Ganglion, right wrist: Secondary | ICD-10-CM
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
## 2016-10-09 ENCOUNTER — Ambulatory Visit (INDEPENDENT_AMBULATORY_CARE_PROVIDER_SITE_OTHER): Payer: Self-pay | Admitting: Physician Assistant

## 2016-10-09 ENCOUNTER — Encounter (INDEPENDENT_AMBULATORY_CARE_PROVIDER_SITE_OTHER): Payer: Self-pay | Admitting: Physician Assistant

## 2016-10-09 DIAGNOSIS — M67431 Ganglion, right wrist: Secondary | ICD-10-CM
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
## 2018-02-07 IMAGING — CR DG HAND COMPLETE 3+V*R*
3 series · 3 of 3 positions shown · non-contrast
Comparison: None.

CLINICAL DATA: 35-year-old male with right hand pain.

EXAM:
RIGHT HAND - COMPLETE 3+ VIEW

[hand pa]
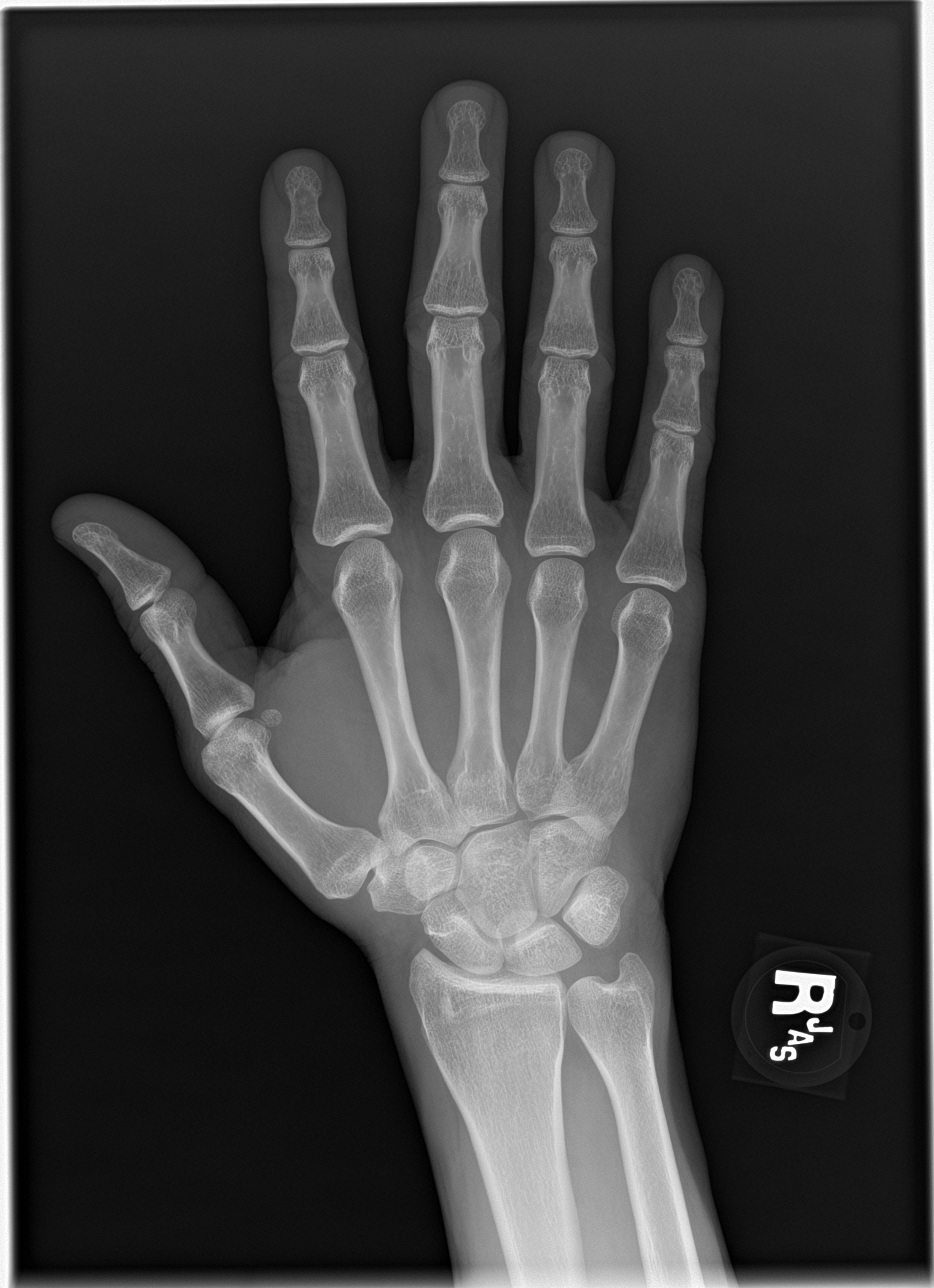

[hand obl]
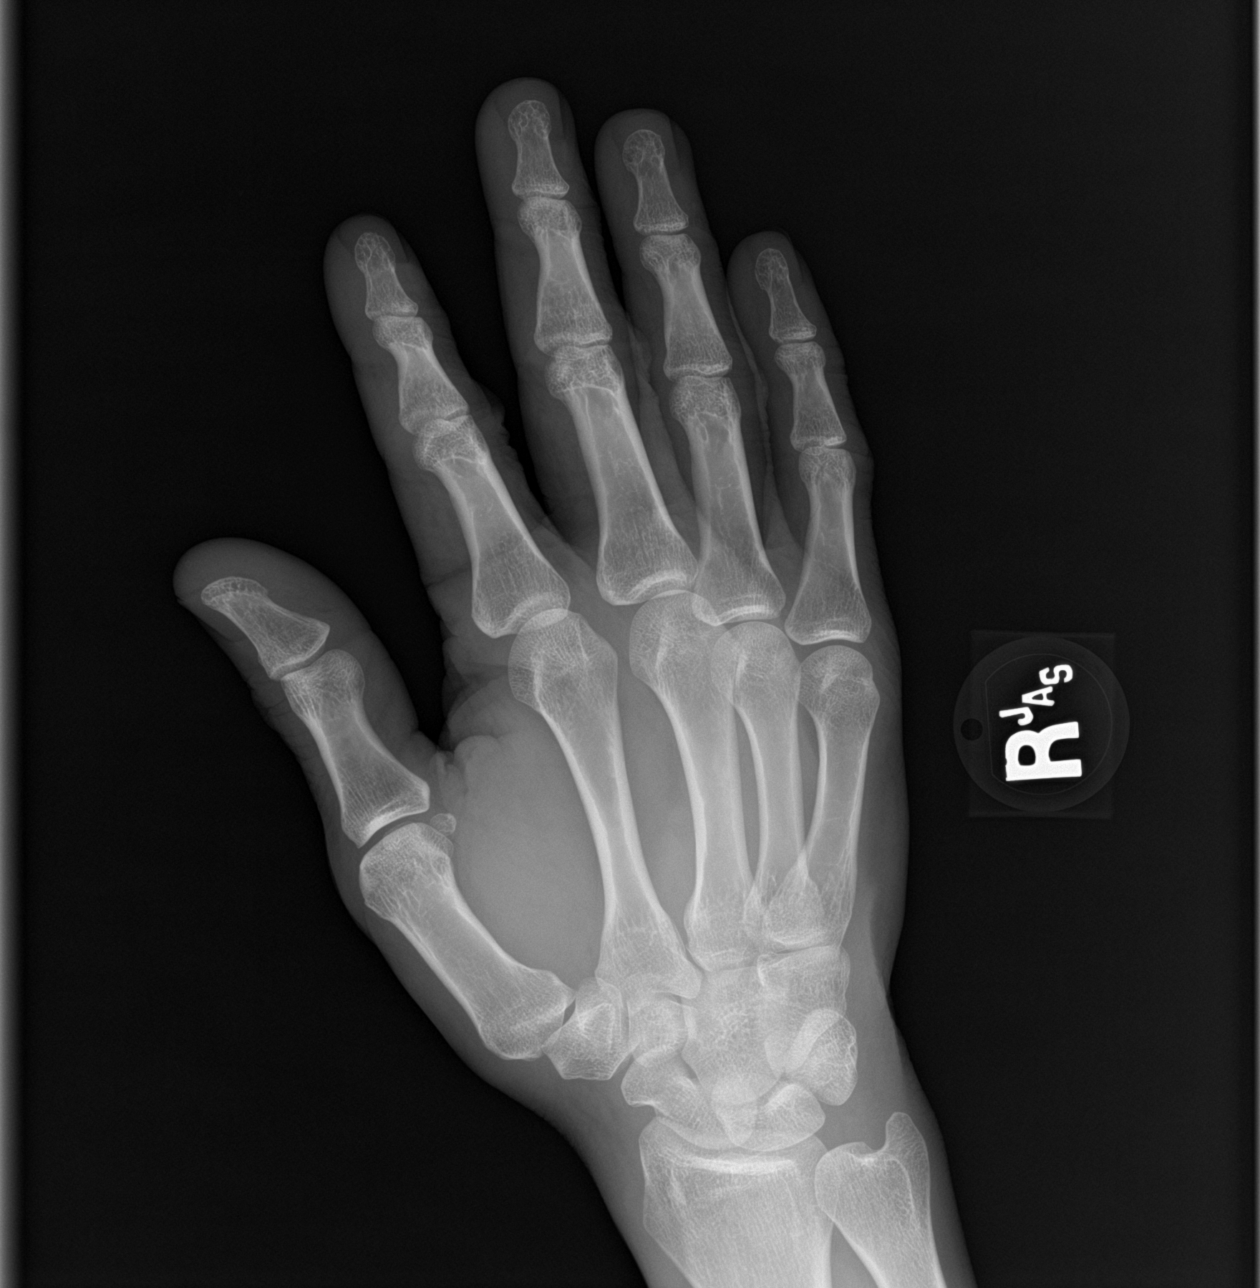

[hand lat]
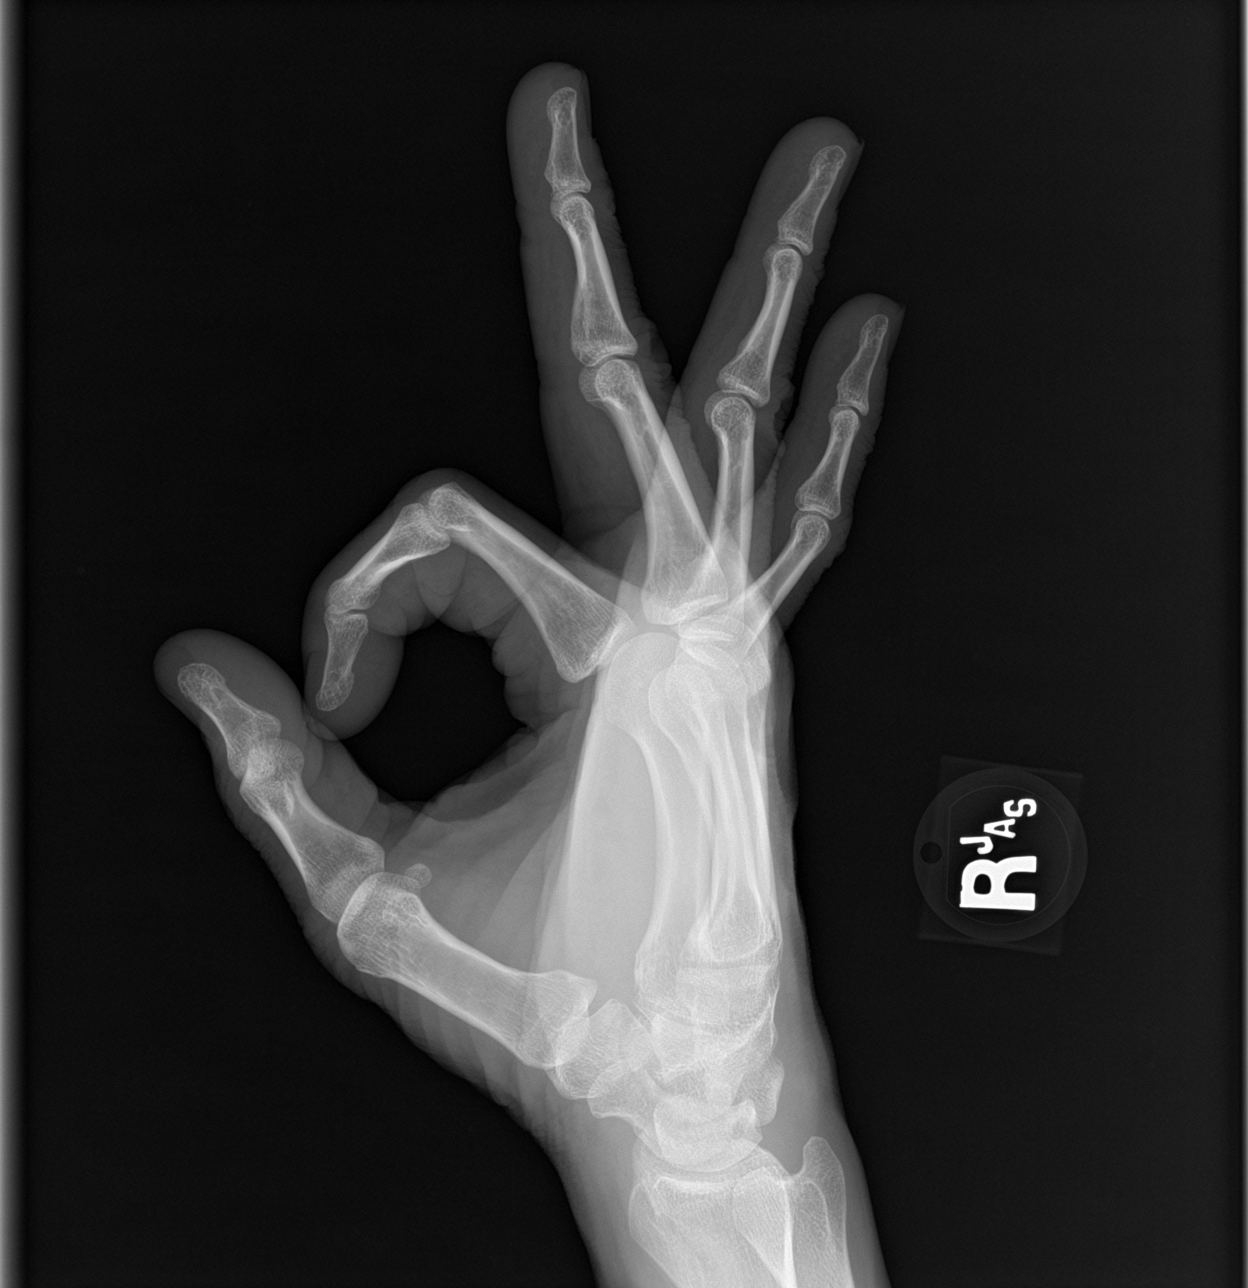

[3 of 3 positions shown; findings below may reference images not displayed]

FINDINGS: There is no evidence of fracture or dislocation. There is no
evidence of arthropathy or other focal bone abnormality. Soft
tissues are unremarkable.
IMPRESSION: Negative.

## 2018-03-16 ENCOUNTER — Encounter (HOSPITAL_COMMUNITY): Payer: Self-pay | Admitting: Emergency Medicine

## 2018-03-16 ENCOUNTER — Emergency Department (HOSPITAL_COMMUNITY)
Admission: EM | Admit: 2018-03-16 | Discharge: 2018-04-13 | Disposition: E | Payer: Self-pay | Attending: Emergency Medicine | Admitting: Emergency Medicine

## 2018-03-16 DIAGNOSIS — Y939 Activity, unspecified: Secondary | ICD-10-CM | POA: Insufficient documentation

## 2018-03-16 DIAGNOSIS — Y249XXA Unspecified firearm discharge, undetermined intent, initial encounter: Secondary | ICD-10-CM | POA: Insufficient documentation

## 2018-03-16 DIAGNOSIS — W3400XA Accidental discharge from unspecified firearms or gun, initial encounter: Secondary | ICD-10-CM

## 2018-03-16 DIAGNOSIS — Y929 Unspecified place or not applicable: Secondary | ICD-10-CM | POA: Insufficient documentation

## 2018-03-16 DIAGNOSIS — S31139A Puncture wound of abdominal wall without foreign body, unspecified quadrant without penetration into peritoneal cavity, initial encounter: Secondary | ICD-10-CM

## 2018-03-16 DIAGNOSIS — S31109A Unspecified open wound of abdominal wall, unspecified quadrant without penetration into peritoneal cavity, initial encounter: Secondary | ICD-10-CM | POA: Insufficient documentation

## 2018-03-16 DIAGNOSIS — Y999 Unspecified external cause status: Secondary | ICD-10-CM | POA: Insufficient documentation

## 2018-03-16 LAB — PREPARE FRESH FROZEN PLASMA
UNIT DIVISION: 0
Unit division: 0

## 2018-03-16 LAB — BPAM FFP
Blood Product Expiration Date: 201905122359
Blood Product Expiration Date: 201905252359
ISSUE DATE / TIME: 201905040117
ISSUE DATE / TIME: 201905040117
Unit Type and Rh: 600
Unit Type and Rh: 6200

## 2018-03-16 LAB — TYPE AND SCREEN
Unit division: 0
Unit division: 0

## 2018-03-16 LAB — BPAM RBC
BLOOD PRODUCT EXPIRATION DATE: 201905092359
BLOOD PRODUCT EXPIRATION DATE: 201905312359
ISSUE DATE / TIME: 201905040117
ISSUE DATE / TIME: 201905040117
UNIT TYPE AND RH: 9500
Unit Type and Rh: 9500

## 2018-03-18 ENCOUNTER — Encounter (INDEPENDENT_AMBULATORY_CARE_PROVIDER_SITE_OTHER): Payer: Self-pay | Admitting: Physician Assistant
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
## 2018-04-13 NOTE — ED Notes (Signed)
Shirt, shorts, socks, shoes, wedding band, gate card all given to [facility].
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
## 2018-04-13 NOTE — Code Documentation (Signed)
Patient time of death occurred at 0125. 
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
## 2018-04-13 DEATH — deceased
